# Patient Record
Sex: Male | Born: 2000 | Race: Black or African American | Hispanic: No | Marital: Single | Smoking: Never smoker
Health system: Southern US, Community
[De-identification: ages and names within clinical notes are randomized; demographics above are authoritative.]

---
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
## 2018-02-12 ENCOUNTER — Ambulatory Visit (HOSPITAL_COMMUNITY)
Admission: EM | Admit: 2018-02-12 | Discharge: 2018-02-12 | Disposition: A | Discharge status: Home / Self Care | Payer: Managed Care, Other (non HMO) | Attending: Family Medicine | Admitting: Family Medicine

## 2018-02-12 ENCOUNTER — Encounter (HOSPITAL_COMMUNITY): Payer: Self-pay | Admitting: *Deleted

## 2018-02-12 DIAGNOSIS — B349 Viral infection, unspecified: Secondary | ICD-10-CM | POA: Diagnosis not present

## 2018-02-12 MED ORDER — IBUPROFEN 400 MG PO TABS: 400.0000 mg | ORAL_TABLET | Freq: Four times a day (QID) | ORAL | 0 refills | Status: DC | PRN

## 2018-02-12 NOTE — ED Provider Notes (Signed)
[facility]    CSN: [id number] Arrival date & time: 02/12/18  1047     History   Chief Complaint Chief Complaint  Patient presents with  . Headache  . Fever    HPI Siddiq Kaluzny is a 17 y.o. male.   George Weaver presents with complaints of headache and fever which started last night after work. Temp of 100 last night. This am temp of 101.9. Headache 7/10, generalized. Took tylenol at 0700 which helped with fever. Denies cough, congestion, ear pain, sore throat, nausea, vomiting, diarrhea, rash, neck pain, urinary symptoms. No known ill contacts. No dizziness. Without contributing medical history.     ROS per HPI.      History reviewed. No pertinent past medical history.  There are no active problems to display for this patient.   History reviewed. No pertinent surgical history.     Home Medications    Prior to Admission medications   Medication Sig Start Date End Date Taking? Authorizing Provider  ibuprofen (ADVIL,MOTRIN) 400 MG tablet Take 1 tablet (400 mg total) by mouth every 6 (six) hours as needed. 02/12/18   Georgetta Haber, NP  Multiple Vitamin (MULITIVITAMIN WITH MINERALS) TABS Take 1 tablet by mouth daily.    [provider]  oseltamivir (TAMIFLU) 75 MG capsule Take 1 capsule (75 mg total) by mouth every 12 (twelve) hours. 10/24/16   Elvina Sidle, MD  vitamin C (ASCORBIC ACID) 500 MG tablet Take 1,000 mg by mouth daily.      [provider]    Family History Family History  Problem Relation Age of Onset  . Diabetes Mother     Social History Social History   Tobacco Use  . Smoking status: Never Smoker  . Smokeless tobacco: Never Used  Substance Use Topics  . Alcohol use: No  . Drug use: Not on file     Allergies   Patient has no known allergies.   Review of Systems Review of Systems   Physical Exam Triage Vital Signs ED Triage Vitals  Enc Vitals Group     BP 02/12/18 1131 119/73     Pulse --      Resp  02/12/18 1131 17     Temp 02/12/18 1131 98.7 F (37.1 C)     Temp Source 02/12/18 1131 Oral     SpO2 02/12/18 1131 99 %     Weight --      Height --      Head Circumference --      Peak Flow --      Pain Score 02/12/18 1130 7     Pain Loc --      Pain Edu? --      Excl. in GC? --    No data found.  Updated Vital Signs BP 119/73 (BP Location: Left Arm)   Temp 98.7 F (37.1 C) (Oral)   Resp 17   SpO2 99%    Physical Exam  Constitutional: He is oriented to person, place, and time. He appears well-developed and well-nourished.  HENT:  Head: Normocephalic and atraumatic.  Right Ear: Tympanic membrane, external ear and ear canal normal.  Left Ear: Tympanic membrane, external ear and ear canal normal.  Nose: Nose normal. Right sinus exhibits no maxillary sinus tenderness and no frontal sinus tenderness. Left sinus exhibits no maxillary sinus tenderness and no frontal sinus tenderness.  Mouth/Throat: Uvula is midline, oropharynx is clear and moist and mucous membranes are normal.  Eyes: Pupils are equal, round,  and reactive to light. Conjunctivae are normal.  Neck: Normal range of motion. No neck rigidity. No Brudzinski's sign and no Kernig's sign noted.  Cardiovascular: Normal rate and regular rhythm.  Pulmonary/Chest: Effort normal and breath sounds normal.  Abdominal: Soft. There is no tenderness.  Lymphadenopathy:    He has no cervical adenopathy.  Neurological: He is alert and oriented to person, place, and time.  Skin: Skin is warm and dry.  Vitals reviewed.    UC Treatments / Results  Labs (all labs ordered are listed, but only abnormal results are displayed) Labs Reviewed - No data to display  EKG None  Radiology No results found.  Procedures Procedures (including critical care time)  Medications Ordered in UC Medications - No data to display  Initial Impression / Assessment and Plan / UC Course  I have reviewed the triage vital signs and the nursing  notes.  Pertinent labs & imaging results that were available during my care of the patient were reviewed by me and considered in my medical decision making (see chart for details).     Afebrile in clinic today. Without tachypnea or tachycardia. Without acute findings on exam. History and physical consistent with viral illness.  Supportive cares recommended. Discussed that further symptoms may still develop. Return precautions provided. Patient verbalized understanding and agreeable to plan.  Ambulatory out of clinic without difficulty.    Final Clinical Impressions(s) / UC Diagnoses   Final diagnoses:  Viral illness     Discharge Instructions     Push fluids to ensure adequate hydration and keep secretions thin.  Tylenol and/or ibuprofen as needed for pain or fevers.  May take benadryl today as well to promote rest and help with headache. If symptoms worsen or do not improve in the next week to return to be seen or to follow up with your PCP.     ED Prescriptions    Medication Sig Dispense Auth. Provider   ibuprofen (ADVIL,MOTRIN) 400 MG tablet Take 1 tablet (400 mg total) by mouth every 6 (six) hours as needed. 30 tablet Georgetta HaberBurky, Ruairi Stutsman B, NP     Controlled Substance Prescriptions [place] Controlled Substance Registry consulted? Not Applicable   Georgetta HaberBurky, Manie Bealer B, NP 02/12/18 1208

## 2018-02-12 NOTE — Discharge Instructions (Signed)
Push fluids to ensure adequate hydration and keep secretions thin.  Tylenol and/or ibuprofen as needed for pain or fevers.  May take benadryl today as well to promote rest and help with headache. If symptoms worsen or do not improve in the next week to return to be seen or to follow up with your PCP.

## 2018-02-12 NOTE — ED Triage Notes (Addendum)
Patient reports headache and fever onset since last night. Patient denies any nasal drainage, nasal congestion, or sore throat. Denies cough. Patient reports taking tylenol at 7a

## 2019-05-16 ENCOUNTER — Encounter (HOSPITAL_BASED_OUTPATIENT_CLINIC_OR_DEPARTMENT_OTHER): Payer: Self-pay | Admitting: *Deleted

## 2019-05-16 ENCOUNTER — Emergency Department (HOSPITAL_BASED_OUTPATIENT_CLINIC_OR_DEPARTMENT_OTHER)
Admission: EM | Admit: 2019-05-16 | Discharge: 2019-05-16 | Disposition: A | Discharge status: Home / Self Care | Payer: Managed Care, Other (non HMO) | Attending: Emergency Medicine | Admitting: Emergency Medicine

## 2019-05-16 ENCOUNTER — Emergency Department (HOSPITAL_BASED_OUTPATIENT_CLINIC_OR_DEPARTMENT_OTHER): Payer: Managed Care, Other (non HMO)

## 2019-05-16 ENCOUNTER — Other Ambulatory Visit: Payer: Self-pay

## 2019-05-16 DIAGNOSIS — X500XXA Overexertion from strenuous movement or load, initial encounter: Secondary | ICD-10-CM | POA: Diagnosis not present

## 2019-05-16 DIAGNOSIS — Y9367 Activity, basketball: Secondary | ICD-10-CM | POA: Insufficient documentation

## 2019-05-16 DIAGNOSIS — Y999 Unspecified external cause status: Secondary | ICD-10-CM | POA: Insufficient documentation

## 2019-05-16 DIAGNOSIS — M25562 Pain in left knee: Secondary | ICD-10-CM | POA: Diagnosis not present

## 2019-05-16 DIAGNOSIS — Y92310 Basketball court as the place of occurrence of the external cause: Secondary | ICD-10-CM | POA: Insufficient documentation

## 2019-05-16 MED ORDER — IBUPROFEN 800 MG PO TABS: 800.0000 mg | ORAL_TABLET | Freq: Three times a day (TID) | ORAL | 0 refills | Status: DC | PRN

## 2019-05-16 NOTE — Discharge Instructions (Signed)

## 2019-05-16 NOTE — ED Provider Notes (Signed)
Emergency Department Provider Note   I have reviewed the triage vital signs and the nursing notes.   HISTORY  Chief Complaint Knee Pain   HPI George Weaver is a 18 y.o. male presents to the ED with left knee pain while playing basketball. He was planting and twisting when he felt a "pop" with pain. He has been ambulatory. No numbness/weakness. Pain is mild/moderate and mainly lateral per patient. No hip or ankle pain. He noted some mild swelling and presents to the ED for evaluation.    History reviewed. No pertinent past medical history.  There are no active problems to display for this patient.   History reviewed. No pertinent surgical history.  Allergies Patient has no known allergies.  Family History  Problem Relation Age of Onset  . Diabetes Mother     Social History Social History   Tobacco Use  . Smoking status: Never Smoker  . Smokeless tobacco: Never Used  Substance Use Topics  . Alcohol use: No  . Drug use: Never    Review of Systems  Constitutional: No fever/chills Eyes: No visual changes. ENT: No sore throat. Cardiovascular: Denies chest pain. Respiratory: Denies shortness of breath. Gastrointestinal: No abdominal pain.  No nausea, no vomiting.  No diarrhea.  No constipation. Genitourinary: Negative for dysuria. Musculoskeletal: Negative for back pain. Positive left knee pain.  Skin: Negative for rash. Neurological: Negative for headaches, focal weakness or numbness.  10-point ROS otherwise negative.  ____________________________________________   PHYSICAL EXAM:  VITAL SIGNS: ED Triage Vitals  Enc Vitals Group     BP 05/16/19 1921 113/80     Pulse Rate 05/16/19 1921 98     Resp 05/16/19 1921 18     Temp 05/16/19 1921 98.5 F (36.9 C)     Temp Source 05/16/19 1921 Oral     SpO2 05/16/19 1921 100 %     Weight 05/16/19 1921 250 lb (113.4 kg)     Height 05/16/19 1921 6\' 4"  (1.93 m)   Constitutional: Alert and oriented. Well  appearing and in no acute distress. Eyes: Conjunctivae are normal.  Head: Atraumatic. Nose: No congestion/rhinnorhea. Mouth/Throat: Mucous membranes are moist.  Neck: No stridor.  Cardiovascular: Normal rate, regular rhythm. Good peripheral circulation.  Respiratory: Normal respiratory effort.  Gastrointestinal: No distention.  Musculoskeletal: No joint laxity. Mild left lateral knee swelling. Normal exam of the ankle and left hip.  Neurologic:  Normal speech and language. Skin:  Skin is warm, dry and intact. No rash noted.  ____________________________________________  RADIOLOGY  Dg Knee Complete 4 Views Left  Result Date: 05/16/2019 CLINICAL DATA:  Knee pain. EXAM: LEFT KNEE - COMPLETE 4+ VIEW COMPARISON:  None. FINDINGS: No evidence of fracture, dislocation, or joint effusion. No evidence of arthropathy or other focal bone abnormality. Soft tissues are unremarkable. IMPRESSION: Negative. Electronically Signed   By: Annia Beltrew  Davis M.D.   On: 05/16/2019 20:09    ____________________________________________   PROCEDURES  Procedure(s) performed:   Procedures  None  ____________________________________________   INITIAL IMPRESSION / ASSESSMENT AND PLAN / ED COURSE  Pertinent labs & imaging results that were available during my care of the patient were reviewed by me and considered in my medical decision making (see chart for details).   Patient presents to the ED with left knee pain. Plain films negative. Plan for RICE and sports med f/u should pain continue. Info given at discharge.   ____________________________________________  FINAL CLINICAL IMPRESSION(S) / ED DIAGNOSES  Final diagnoses:  Acute pain of  left knee    Note:  This document was prepared using Dragon voice recognition software and may include unintentional dictation errors.  Nanda Quinton, MD Emergency Medicine    Long, [facility], MD 05/17/19 [phone]

## 2019-05-16 NOTE — ED Triage Notes (Signed)
Pt reports he was playing basketball today and when he came came his left knee popped and he fell on his right side. C/o knee pain and pain with ambulating

## 2019-05-18 ENCOUNTER — Ambulatory Visit (INDEPENDENT_AMBULATORY_CARE_PROVIDER_SITE_OTHER): Payer: Managed Care, Other (non HMO) | Admitting: Family Medicine

## 2019-05-18 ENCOUNTER — Encounter: Payer: Self-pay | Admitting: Family Medicine

## 2019-05-18 ENCOUNTER — Ambulatory Visit: Payer: Self-pay

## 2019-05-18 ENCOUNTER — Other Ambulatory Visit: Payer: Self-pay

## 2019-05-18 VITALS — BP 126/92 | Ht 73.0 in | Wt 250.0 lb

## 2019-05-18 DIAGNOSIS — M25562 Pain in left knee: Secondary | ICD-10-CM

## 2019-05-18 DIAGNOSIS — S83422A Sprain of lateral collateral ligament of left knee, initial encounter: Secondary | ICD-10-CM

## 2019-05-18 NOTE — Patient Instructions (Signed)
Nice to meet you Please try ice  Please try range of motion movements   Please wear the brace with any walking. Please try to use the crutches Please send me a message in MyChart with any questions or updates.  Please see me back in 2 weeks.   --Dr. Raeford Razor

## 2019-05-18 NOTE — Progress Notes (Signed)
George Weaver - 18 y.o. male MRN [id number]  Date of birth: 2000/10/30  SUBJECTIVE:  Including CC & ROS.  Chief Complaint  Patient presents with  . Knee Pain    left knee    George Weaver is a 18 y.o. male that is presenting with acute left knee pain.  He had an injury while playing basketball 9/13.  He was able to get up and walk after this.  Currently is having significant pain over the general knee.  He is having sharp and stabbing pain.  Seems to be localized to the knee.  No history of similar injuries or pain.  He is unsure of the mechanism of how he hurt his knee.  He did feel a pop when it occurred..  Independent review of the left knee x-ray from 9/13 shows no acute abnormality.   Review of Systems  Constitutional: Negative for fever.  HENT: Negative for congestion.   Respiratory: Negative for cough.   Cardiovascular: Negative for chest pain.  Gastrointestinal: Negative for abdominal distention.  Musculoskeletal: Positive for gait problem.  Skin: Negative for color change.  Neurological: Negative for weakness.  Hematological: Negative for adenopathy.    HISTORY: Past Medical, Surgical, Social, and Family History Reviewed & Updated per EMR.   Pertinent Historical Findings include:  No past medical history on file.  No past surgical history on file.  No Known Allergies  Family History  Problem Relation Age of Onset  . Diabetes Mother      Social History   Socioeconomic History  . Marital status: Single    Spouse name: Not on file  . Number of children: Not on file  . Years of education: Not on file  . Highest education level: Not on file  Occupational History  . Not on file  Social Needs  . Financial resource strain: Not on file  . Food insecurity    Worry: Not on file    Inability: Not on file  . Transportation needs    Medical: Not on file    Non-medical: Not on file  Tobacco Use  . Smoking status: Never Smoker  . Smokeless tobacco: Never Used   Substance and Sexual Activity  . Alcohol use: No  . Drug use: Never  . Sexual activity: Not on file  Lifestyle  . Physical activity    Days per week: Not on file    Minutes per session: Not on file  . Stress: Not on file  Relationships  . Social Herbalist on phone: Not on file    Gets together: Not on file    Attends religious service: Not on file    Active member of club or organization: Not on file    Attends meetings of clubs or organizations: Not on file    Relationship status: Not on file  . Intimate partner violence    Fear of current or ex partner: Not on file    Emotionally abused: Not on file    Physically abused: Not on file    Forced sexual activity: Not on file  Other Topics Concern  . Not on file  Social History Narrative  . Not on file     PHYSICAL EXAM:  VS: BP (!) 126/92   Ht 6\' 1"  (1.854 m)   Wt 250 lb (113.4 kg)   BMI 32.98 kg/m  Physical Exam Gen: NAD, alert, cooperative with exam, well-appearing ENT: normal lips, normal nasal mucosa,  Eye: normal EOM, normal conjunctiva  and lids CV:  no edema, +2 pedal pulses   Resp: no accessory muscle use, non-labored,  Skin: no rashes, no areas of induration  Neuro: normal tone, normal sensation to touch Psych:  normal insight, alert and oriented MSK:  Left knee: No obvious effusion. Normal passive flexion abduction. Some pain with varus stress testing. Tenderness palpation of the lateral femoral condyle. Positive Lockman and anterior drawer. Negative McMurray's test. Neurovascular intact  Limited ultrasound: Left knee:  Mild effusion within the suprapatellar pouch. Normal-appearing quadricep patellar tendon. Normal-appearing medial joint line. LCL appears to be intact but has a small effusion superficial to the ligament  Summary: Effusion and an LCL sprain  Ultrasound and interpretation by Clare GandyJeremy , MD      ASSESSMENT & PLAN:   Acute pain of left knee Appears to have an  LCL sprain.  He does not demonstrate a large effusion but there does appear to be translation on Lockman and anterior drawer testing.  He also heard a pop would suggest more of an ACL problem. -Hinged knee brace today. -Counseled on home exercise therapy and supportive care. -Provided with a work note. - Follow-up in 2 weeks.  May need to consider MRI

## 2019-05-19 DIAGNOSIS — S83519A Sprain of anterior cruciate ligament of unspecified knee, initial encounter: Secondary | ICD-10-CM | POA: Insufficient documentation

## 2019-05-19 NOTE — Assessment & Plan Note (Signed)
Appears to have an LCL sprain.  He does not demonstrate a large effusion but there does appear to be translation on Lockman and anterior drawer testing.  He also heard a pop would suggest more of an ACL problem. -Hinged knee brace today. -Counseled on home exercise therapy and supportive care. -Provided with a work note. - Follow-up in 2 weeks.  May need to consider MRI

## 2019-05-26 ENCOUNTER — Ambulatory Visit (INDEPENDENT_AMBULATORY_CARE_PROVIDER_SITE_OTHER): Payer: Managed Care, Other (non HMO) | Admitting: Family Medicine

## 2019-05-26 ENCOUNTER — Encounter: Payer: Self-pay | Admitting: Family Medicine

## 2019-05-26 ENCOUNTER — Other Ambulatory Visit: Payer: Self-pay

## 2019-05-26 VITALS — BP 134/82 | Ht 73.0 in | Wt 250.0 lb

## 2019-05-26 DIAGNOSIS — S83512D Sprain of anterior cruciate ligament of left knee, subsequent encounter: Secondary | ICD-10-CM | POA: Diagnosis not present

## 2019-05-26 NOTE — Patient Instructions (Signed)
Good to see you Please try ice and use the crutches to help with getting around  Please send me a message in MyChart with any questions or updates.  We will set up a virtual visit once the MRI is completed to discuss the results.   --Dr. Raeford Razor

## 2019-05-26 NOTE — Assessment & Plan Note (Signed)
Hasn't had much improvement in the knee. Likely ACL tear given history and exam.  - MRi to evaluate for ACL tear  - counseled on supportive care - provided work note.

## 2019-05-26 NOTE — Progress Notes (Signed)
George Weaver - 18 y.o. male MRN [id number]  Date of birth: 2000-11-15  SUBJECTIVE:  Including CC & ROS.  Chief Complaint  Patient presents with  . Follow-up    follow up for left knee    George Weaver is a 18 y.o. male that is following up for his left knee pain. He has been using crutches. The pain is about the same. His knee feels unstable. He doesn't feel comfortable placing his weight on the knee. No radicular symptoms. Pain is moderate to severe. Localized to the knee.   Review of Systems  Constitutional: Negative for fever.  HENT: Negative for congestion.   Respiratory: Negative for cough.   Cardiovascular: Negative for chest pain.  Gastrointestinal: Negative for abdominal pain.  Musculoskeletal: Positive for gait problem.  Skin: Negative for color change.  Neurological: Negative for weakness.  Hematological: Negative for adenopathy.    HISTORY: Past Medical, Surgical, Social, and Family History Reviewed & Updated per EMR.   Pertinent Historical Findings include:  No past medical history on file.  No past surgical history on file.  No Known Allergies  Family History  Problem Relation Age of Onset  . Diabetes Mother      Social History   Socioeconomic History  . Marital status: Single    Spouse name: Not on file  . Number of children: Not on file  . Years of education: Not on file  . Highest education level: Not on file  Occupational History  . Not on file  Social Needs  . Financial resource strain: Not on file  . Food insecurity    Worry: Not on file    Inability: Not on file  . Transportation needs    Medical: Not on file    Non-medical: Not on file  Tobacco Use  . Smoking status: Never Smoker  . Smokeless tobacco: Never Used  Substance and Sexual Activity  . Alcohol use: No  . Drug use: Never  . Sexual activity: Not on file  Lifestyle  . Physical activity    Days per week: Not on file    Minutes per session: Not on file  . Stress: Not on file   Relationships  . Social Musician on phone: Not on file    Gets together: Not on file    Attends religious service: Not on file    Active member of club or organization: Not on file    Attends meetings of clubs or organizations: Not on file    Relationship status: Not on file  . Intimate partner violence    Fear of current or ex partner: Not on file    Emotionally abused: Not on file    Physically abused: Not on file    Forced sexual activity: Not on file  Other Topics Concern  . Not on file  Social History Narrative  . Not on file     PHYSICAL EXAM:  VS: BP 134/82   Ht 6\' 1"  (1.854 m)   Wt 250 lb (113.4 kg)   BMI 32.98 kg/m  Physical Exam Gen: NAD, alert, cooperative with exam, well-appearing ENT: normal lips, normal nasal mucosa,  Eye: normal EOM, normal conjunctiva and lids CV:  no edema, +2 pedal pulses   Resp: no accessory muscle use, non-labored,  Skin: no rashes, no areas of induration  Neuro: normal tone, normal sensation to touch Psych:  normal insight, alert and oriented MSK:  Left knee:  Mild effusion  Normal strength to  resistance  Positive Anterior drawer and Lachman's test.  NVI      ASSESSMENT & PLAN:   ACL tear Hasn't had much improvement in the knee. Likely ACL tear given history and exam.  - MRi to evaluate for ACL tear  - counseled on supportive care - provided work note.

## 2019-06-01 ENCOUNTER — Ambulatory Visit: Payer: Managed Care, Other (non HMO) | Admitting: Family Medicine

## 2019-06-17 ENCOUNTER — Ambulatory Visit
Admission: RE | Admit: 2019-06-17 | Discharge: 2019-06-17 | Disposition: A | Discharge status: Home / Self Care | Payer: Managed Care, Other (non HMO) | Source: Ambulatory Visit | Attending: Family Medicine | Admitting: Family Medicine

## 2019-06-17 ENCOUNTER — Other Ambulatory Visit: Payer: Self-pay

## 2019-06-17 DIAGNOSIS — S83512D Sprain of anterior cruciate ligament of left knee, subsequent encounter: Secondary | ICD-10-CM

## 2019-06-21 ENCOUNTER — Telehealth: Payer: Self-pay | Admitting: Family Medicine

## 2019-06-21 ENCOUNTER — Ambulatory Visit (INDEPENDENT_AMBULATORY_CARE_PROVIDER_SITE_OTHER): Payer: Managed Care, Other (non HMO) | Admitting: Family Medicine

## 2019-06-21 ENCOUNTER — Other Ambulatory Visit: Payer: Self-pay

## 2019-06-21 DIAGNOSIS — S83512D Sprain of anterior cruciate ligament of left knee, subsequent encounter: Secondary | ICD-10-CM

## 2019-06-21 NOTE — Telephone Encounter (Signed)
Spoke with patient. Referral to PT placed.   Rosemarie Ax, MD [facility] 06/21/2019, 1:31 PM

## 2019-06-21 NOTE — Progress Notes (Signed)
Virtual Visit via Telephone Note  I connected with George Weaver on 06/21/19 at 10:50 AM EDT by telephone and verified that I am speaking with the correct person using two identifiers.   I discussed the limitations, risks, security and privacy concerns of performing an evaluation and management service by telephone and the availability of in person appointments. I also discussed with the patient that there may be a patient responsible charge related to this service. The patient expressed understanding and agreed to proceed.   History of Present Illness:  George Weaver is a 18 year old male that is following up for his left knee pain after an MRI was completed.  It was demonstrating a near complete tear of the ACL.  He reports going back to work and has been trying to do his normal activities.  He denies any severe pain and has been close to near normal.   Observations/Objective:  Gen: NAD, alert,  Psych:  normal insight, alert and oriented   Assessment and Plan:  ACL tear  MRI was confirming that he has a near complete ACL tear.He elects to perform conservative measures at this point. -Counseled on conservative therapy and surgery. -Counseled on brace. -Counseled on work he may need a work note. -He will call back if he would like to try physical therapy or pursue surgery.  Follow Up Instructions:    I discussed the assessment and treatment plan with the patient. The patient was provided an opportunity to ask questions and all were answered. The patient agreed with the plan and demonstrated an understanding of the instructions.   The patient was advised to call back or seek an in-person evaluation if the symptoms worsen or if the condition fails to improve as anticipated.  I provided 8 minutes of non-face-to-face time during this encounter.   Clearance Coots, MD

## 2019-06-21 NOTE — Assessment & Plan Note (Signed)
MRI was confirming that he has a near complete ACL tear.He elects to perform conservative measures at this point. -Counseled on conservative therapy and surgery. -Counseled on brace. -Counseled on work he may need a work note. -He will call back if he would like to try physical therapy or pursue surgery.

## 2019-08-30 ENCOUNTER — Encounter (HOSPITAL_COMMUNITY): Payer: Self-pay

## 2019-08-30 ENCOUNTER — Other Ambulatory Visit: Payer: Self-pay

## 2019-08-30 ENCOUNTER — Ambulatory Visit (HOSPITAL_COMMUNITY)
Admission: EM | Admit: 2019-08-30 | Discharge: 2019-08-30 | Disposition: A | Discharge status: Home / Self Care | Payer: Managed Care, Other (non HMO) | Attending: Family Medicine | Admitting: Family Medicine

## 2019-08-30 DIAGNOSIS — U071 COVID-19: Secondary | ICD-10-CM | POA: Diagnosis not present

## 2019-08-30 DIAGNOSIS — Z20822 Contact with and (suspected) exposure to covid-19: Secondary | ICD-10-CM

## 2019-08-30 DIAGNOSIS — J3489 Other specified disorders of nose and nasal sinuses: Secondary | ICD-10-CM | POA: Diagnosis not present

## 2019-08-30 DIAGNOSIS — Z20828 Contact with and (suspected) exposure to other viral communicable diseases: Secondary | ICD-10-CM | POA: Diagnosis present

## 2019-08-30 NOTE — Discharge Instructions (Signed)
You must quarantine until the test result is available The test can be reviewed on My Chart Tylenol for pain or fever May use OTC cough and cold medicine CALL for problems

## 2019-08-30 NOTE — ED Triage Notes (Signed)
Patient presents to Urgent Care with complaints of needing a covid test since waking up with a runny nose.

## 2019-08-30 NOTE — ED Provider Notes (Signed)
[facility]    CSN: [id number] Arrival date & time: 08/30/19  7829      History   Chief Complaint Chief Complaint  Patient presents with  . covid test    HPI George Weaver is a 18 y.o. male.   HPI  Patient woke up with clear rhinorrhea Feels well Must have covid test per his employer History reviewed. No pertinent past medical history.  Patient Active Problem List   Diagnosis Date Noted  . ACL tear 05/19/2019    History reviewed. No pertinent surgical history.     Home Medications    Prior to Admission medications   Medication Sig Start Date End Date Taking? Authorizing Provider  ibuprofen (ADVIL) 800 MG tablet Take 1 tablet (800 mg total) by mouth every 8 (eight) hours as needed for moderate pain. 05/16/19   Long, Wonda Olds, MD  Multiple Vitamin (MULITIVITAMIN WITH MINERALS) TABS Take 1 tablet by mouth daily.    [provider]  vitamin C (ASCORBIC ACID) 500 MG tablet Take 1,000 mg by mouth daily.      [provider]    Family History Family History  Problem Relation Age of Onset  . Diabetes Mother     Social History Social History   Tobacco Use  . Smoking status: Never Smoker  . Smokeless tobacco: Never Used  Substance Use Topics  . Alcohol use: No  . Drug use: Never     Allergies   Patient has no known allergies.   Review of Systems Review of Systems  Constitutional: Negative for chills and fever.  HENT: Positive for rhinorrhea. Negative for congestion and hearing loss.   Eyes: Negative for pain.  Respiratory: Negative for cough and shortness of breath.   Cardiovascular: Negative for chest pain and leg swelling.  Gastrointestinal: Negative for abdominal pain, constipation and diarrhea.  Genitourinary: Negative for dysuria and frequency.  Musculoskeletal: Negative for myalgias.  Neurological: Negative for dizziness, seizures and headaches.  Psychiatric/Behavioral: The patient is not nervous/anxious.       Physical Exam Triage Vital Signs ED Triage Vitals  Enc Vitals Group     BP 08/30/19 0948 125/76     Pulse Rate 08/30/19 0948 86     Resp 08/30/19 0948 16     Temp 08/30/19 0948 99.5 F (37.5 C)     Temp Source 08/30/19 0948 Oral     SpO2 08/30/19 0948 99 %     Weight --      Height --      Head Circumference --      Peak Flow --      Pain Score 08/30/19 0946 0     Pain Loc --      Pain Edu? --      Excl. in [place]? --    No data found.  Updated Vital Signs BP 125/76 (BP Location: Right Arm)   Pulse 86   Temp 99.5 F (37.5 C) (Oral)   Resp 16   SpO2 99%   Visual Acuity Right Eye Distance:   Left Eye Distance:   Bilateral Distance:    Right Eye Near:   Left Eye Near:    Bilateral Near:     Physical Exam Constitutional:      General: He is not in acute distress.    Appearance: He is well-developed. He is obese.  HENT:     Head: Normocephalic and atraumatic.     Right Ear: Tympanic membrane and ear canal normal.  Left Ear: Tympanic membrane and ear canal normal.     Nose: Congestion and rhinorrhea present.     Mouth/Throat:     Mouth: Mucous membranes are moist.     Pharynx: No posterior oropharyngeal erythema.  Eyes:     Conjunctiva/sclera: Conjunctivae normal.     Pupils: Pupils are equal, round, and reactive to light.  Cardiovascular:     Rate and Rhythm: Normal rate and regular rhythm.     Heart sounds: Normal heart sounds.  Pulmonary:     Effort: Pulmonary effort is normal. No respiratory distress.     Breath sounds: Normal breath sounds.  Abdominal:     General: There is no distension.     Palpations: Abdomen is soft.  Musculoskeletal:        General: Normal range of motion.     Cervical back: Normal range of motion.  Skin:    General: Skin is warm and dry.  Neurological:     Mental Status: He is alert.  Psychiatric:        Mood and Affect: Mood normal.        Behavior: Behavior normal.      UC Treatments / Results  Labs (all labs  ordered are listed, but only abnormal results are displayed) Labs Reviewed  NOVEL CORONAVIRUS, NAA (HOSP ORDER, SEND-OUT TO REF LAB; TAT 18-24 HRS)    EKG   Radiology No results found.  Procedures Procedures (including critical care time)  Medications Ordered in UC Medications - No data to display  Initial Impression / Assessment and Plan / UC Course  I have reviewed the triage vital signs and the nursing notes.  Pertinent labs & imaging results that were available during my care of the patient were reviewed by me and considered in my medical decision making (see chart for details).     discussed Final Clinical Impressions(s) / UC Diagnoses   Final diagnoses:  Rhinorrhea  Encounter for laboratory testing for COVID-19 virus     Discharge Instructions     You must quarantine until the test result is available The test can be reviewed on My Chart Tylenol for pain or fever May use OTC cough and cold medicine CALL for problems   ED Prescriptions    None     PDMP not reviewed this encounter.   Eustace Moore, MD 08/30/19 2142

## 2019-08-31 LAB — NOVEL CORONAVIRUS, NAA (HOSP ORDER, SEND-OUT TO REF LAB; TAT 18-24 HRS): SARS-CoV-2, NAA: DETECTED — AB

## 2019-09-01 ENCOUNTER — Telehealth: Payer: Self-pay | Admitting: Emergency Medicine

## 2019-09-01 NOTE — Telephone Encounter (Signed)

## 2019-09-02 ENCOUNTER — Telehealth (HOSPITAL_COMMUNITY): Payer: Self-pay | Admitting: Emergency Medicine

## 2019-09-02 NOTE — Telephone Encounter (Signed)
Attempted to reach patient. No answer at this time. Voicemail left.  Letter sent.

## 2019-09-02 NOTE — Telephone Encounter (Signed)
Patient contacted by phone and made aware of  Positive covid results. Pt verbalized understanding and had all questions answered.   

## 2020-02-26 ENCOUNTER — Ambulatory Visit (HOSPITAL_COMMUNITY)
Admission: EM | Admit: 2020-02-26 | Discharge: 2020-02-26 | Disposition: A | Discharge status: Home / Self Care | Payer: Managed Care, Other (non HMO) | Attending: Physician Assistant | Admitting: Physician Assistant

## 2020-02-26 ENCOUNTER — Other Ambulatory Visit: Payer: Self-pay

## 2020-02-26 ENCOUNTER — Encounter (HOSPITAL_COMMUNITY): Payer: Self-pay

## 2020-02-26 DIAGNOSIS — H6121 Impacted cerumen, right ear: Secondary | ICD-10-CM

## 2020-02-26 NOTE — ED Notes (Signed)
Irrigated right ear with warm water/H2O2; large solid matter of cerumen evacuated. Pt states he feels much better.

## 2020-02-26 NOTE — ED Provider Notes (Signed)
[facility]    CSN: [id number] Arrival date & time: 02/26/20  1406      History   Chief Complaint Chief Complaint  Patient presents with  . ear fullness    HPI George Weaver is a 19 y.o. male.      Who awoke this am with decreased hearing and "fullness" of the right ear. No pain. No congestion. No fevers. No additional symtpoms.      No past medical history on file.  Patient Active Problem List   Diagnosis Date Noted  . ACL tear 05/19/2019    No past surgical history on file.     Home Medications    Prior to Admission medications   Medication Sig Start Date End Date Taking? Authorizing Provider  ibuprofen (ADVIL) 800 MG tablet Take 1 tablet (800 mg total) by mouth every 8 (eight) hours as needed for moderate pain. 05/16/19   Long, Arlyss Repress, MD  Multiple Vitamin (MULITIVITAMIN WITH MINERALS) TABS Take 1 tablet by mouth daily.    [provider]  vitamin C (ASCORBIC ACID) 500 MG tablet Take 1,000 mg by mouth daily.      [provider]    Family History Family History  Problem Relation Age of Onset  . Diabetes Mother     Social History Social History   Tobacco Use  . Smoking status: Never Smoker  . Smokeless tobacco: Never Used  Vaping Use  . Vaping Use: Never used  Substance Use Topics  . Alcohol use: No  . Drug use: Never     Allergies   Patient has no known allergies.   Review of Systems Review of Systems  Constitutional: Negative for fever.  HENT: Negative for congestion, ear discharge and ear pain.   Respiratory: Negative for cough.   Allergic/Immunologic: Negative for food allergies.  All other systems reviewed and are negative.    Physical Exam Triage Vital Signs ED Triage Vitals  Enc Vitals Group     BP 02/26/20 1423 122/77     Pulse Rate 02/26/20 1423 82     Resp 02/26/20 1423 16     Temp 02/26/20 1423 98.4 F (36.9 C)     Temp Source 02/26/20 1423 Oral     SpO2 02/26/20 1423 99 %     Weight  --      Height --      Head Circumference --      Peak Flow --      Pain Score 02/26/20 1419 0     Pain Loc --      Pain Edu? --      Excl. in [place]? --    No data found.  Updated Vital Signs BP 122/77 (BP Location: Right Arm)   Pulse 82   Temp 98.4 F (36.9 C) (Oral)   Resp 16   SpO2 99%   Visual Acuity Right Eye Distance:   Left Eye Distance:   Bilateral Distance:    Right Eye Near:   Left Eye Near:    Bilateral Near:     Physical Exam Vitals and nursing note reviewed.  Constitutional:      Appearance: Normal appearance.  HENT:     Right Ear: There is impacted cerumen.     Ears:     Comments: Right ear with impacted cerumen Eyes:     General: No scleral icterus. Pulmonary:     Effort: Pulmonary effort is normal.  Musculoskeletal:     Cervical back: Neck  supple.  Skin:    General: Skin is warm and dry.     Findings: No rash.  Neurological:     General: No focal deficit present.     Mental Status: He is alert and oriented to person, place, and time.  Psychiatric:        Mood and Affect: Mood normal.      UC Treatments / Results  Labs (all labs ordered are listed, but only abnormal results are displayed) Labs Reviewed - No data to display  EKG   Radiology No results found.  Procedures Procedures (including critical care time)  Medications Ordered in UC Medications - No data to display  Initial Impression / Assessment and Plan / UC Course  I have reviewed the triage vital signs and the nursing notes.  Pertinent labs & imaging results that were available during my care of the patient were reviewed by me and considered in my medical decision making (see chart for details).     Successful ear wash. Suggested OTC regimens in the future for prevention.  Final Clinical Impressions(s) / UC Diagnoses   Final diagnoses:  None   Discharge Instructions   None    ED Prescriptions    None     PDMP not reviewed this encounter.   Bjorn Pippin, [place] 02/26/20 1507

## 2020-02-26 NOTE — ED Triage Notes (Signed)
Pt c/o difficulty hearing/ear fullness to right ear onset this morning. Denies ear pain, HA, congestion, sore throat, fever, chills or other c/o.  States h/o cerumen impaction.

## 2020-02-26 NOTE — Discharge Instructions (Addendum)
May use Debrox otc to help prevent further issues with this. Also a ear candle is useful as well.

## 2020-06-05 IMAGING — MR MR KNEE*L* W/O CM
7 series · 38 of 40 positions shown · non-contrast
Comparison: Left knee x-rays dated May 16, 2019.

CLINICAL DATA: Persistent left knee pain since injury playing
baseball a month ago.

EXAM:
MRI OF THE LEFT KNEE WITHOUT CONTRAST
TECHNIQUE: Multiplanar, multisequence MR imaging of the knee was performed. No
intravenous contrast was administered.

[Series 6: T2 fat-sat · axial · left · 4.0mm · 0.50mm/px · z∈[-82,+70]mm · 6 of 36 slices shown (1 of 3)]
[im 1/36]
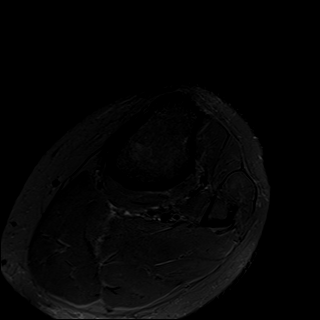
[im 8/36]
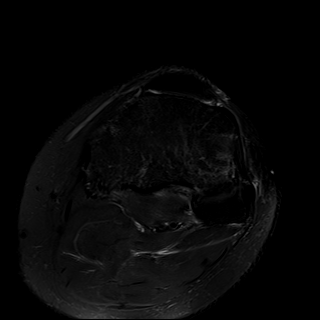
[im 15/36]
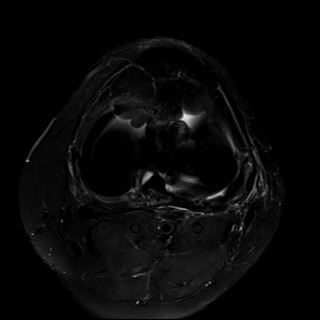
[im 22/36]
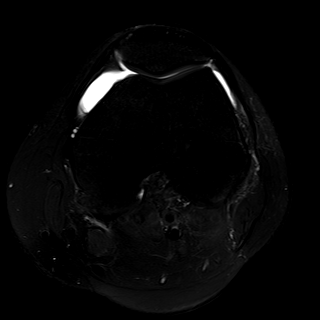
[im 29/36]
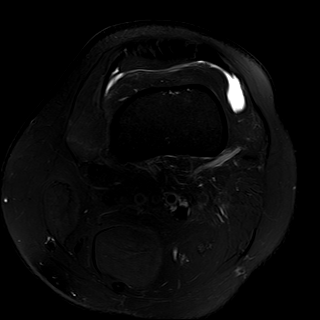
[im 36/36]
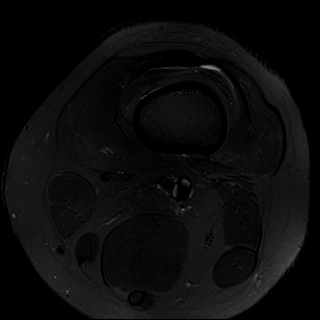

[Series 7: T2 fat-sat · coronal · left · 4.0mm · 0.39mm/px · 6 of 28 slices shown (2 of 3)]
[im 1/28]
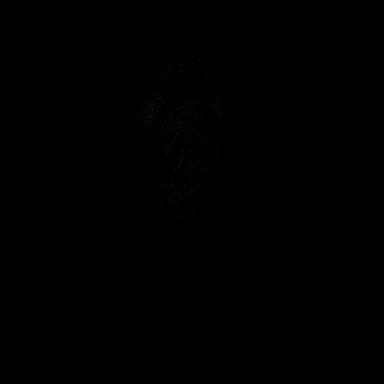
[im 6/28]
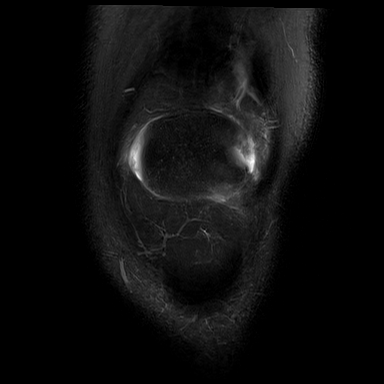
[im 11/28]
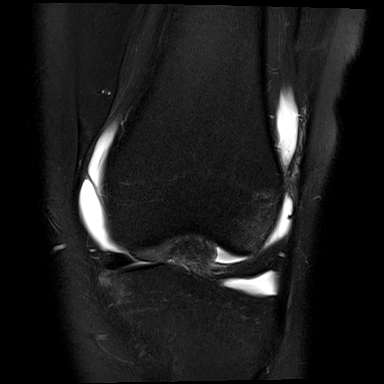
[im 17/28]
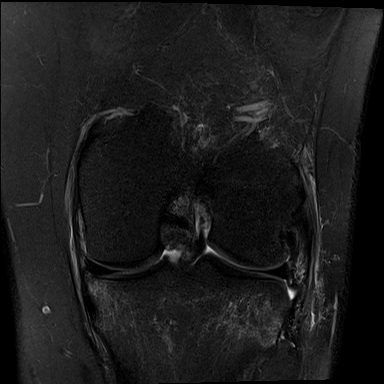
[im 22/28]
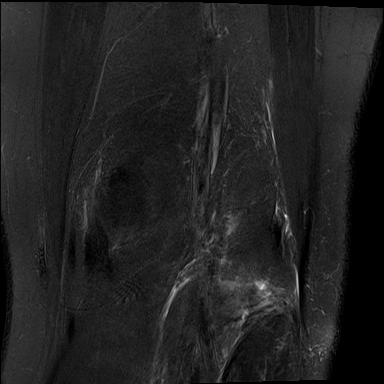
[im 28/28]
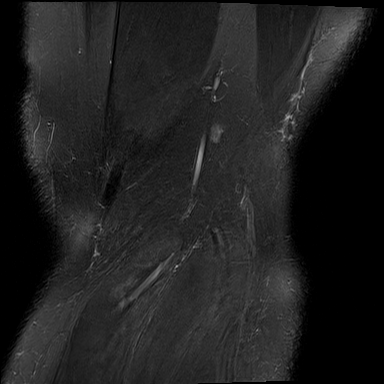

[Series 8: T1 · coronal · left · 4.0mm · 0.39mm/px · 4 of 28 slices shown]
[im 1/28]
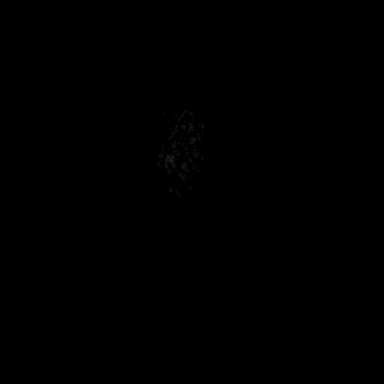
[im 6/28]
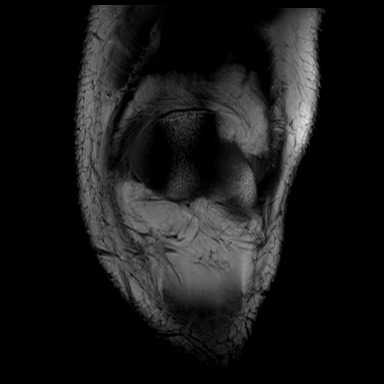
[im 11/28]
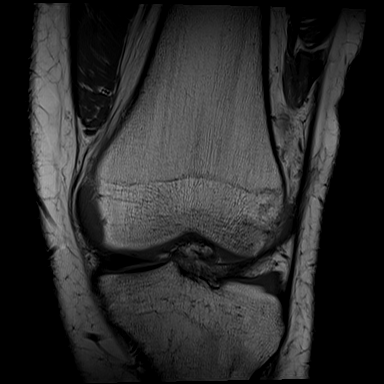
[im 17/28]
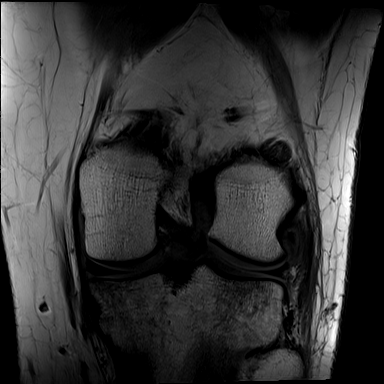

[Series 9: PD fat-sat · coronal · left · 3.0mm · 0.47mm/px · 6 of 28 slices shown (1 of 2)]
[im 1/28]
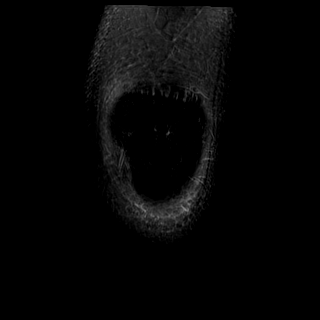
[im 6/28]
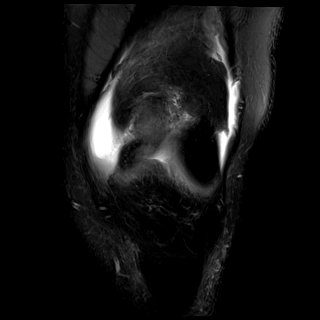
[im 11/28]
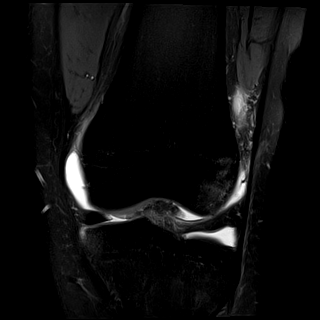
[im 17/28]
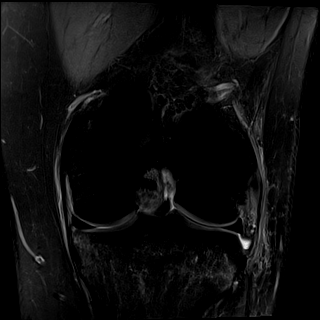
[im 22/28]
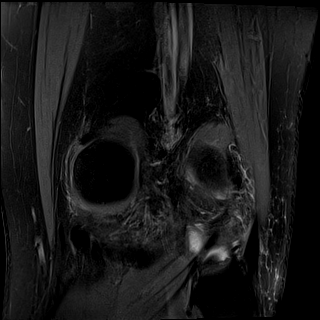
[im 28/28]
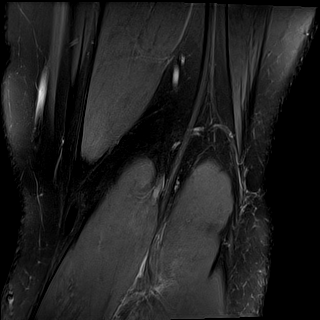

[Series 10: PD fat-sat · sagittal · left · 3.0mm · 0.39mm/px · 6 of 27 slices shown (2 of 2)]
[im 1/27]
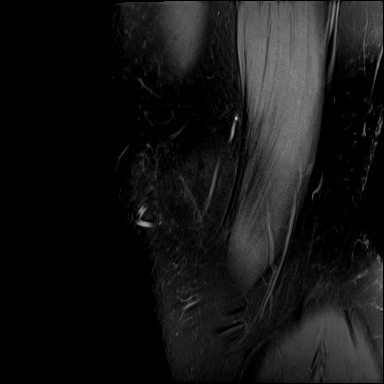
[im 6/27]
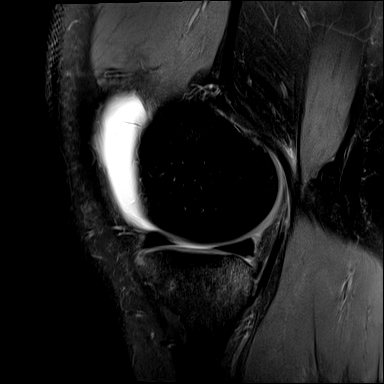
[im 11/27]
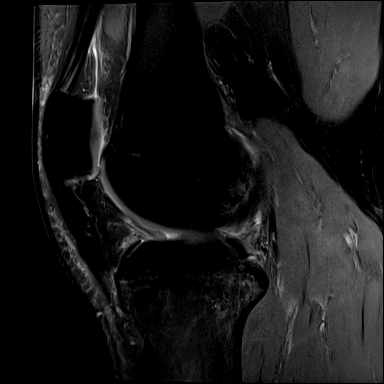
[im 16/27]
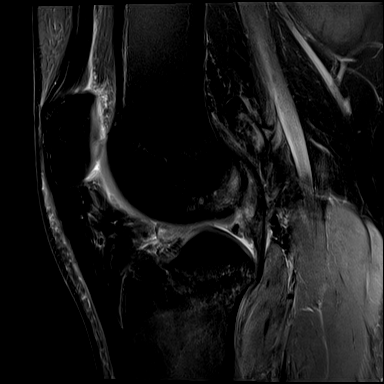
[im 21/27]
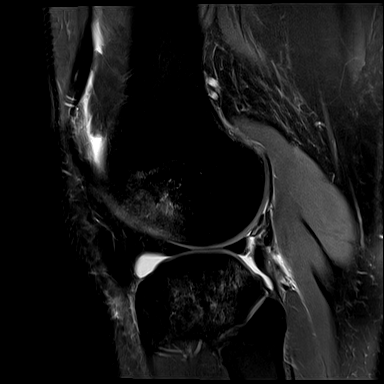
[im 27/27]
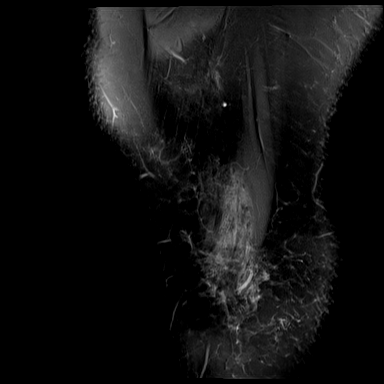

[Series 11: T2 fat-sat · sagittal · left · 3.0mm · 0.39mm/px · 6 of 27 slices shown (3 of 3)]
[im 1/27]
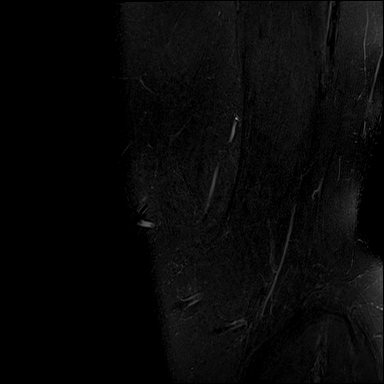
[im 6/27]
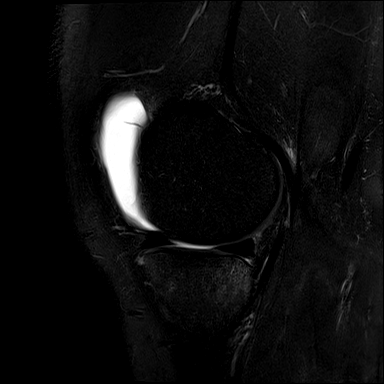
[im 11/27]
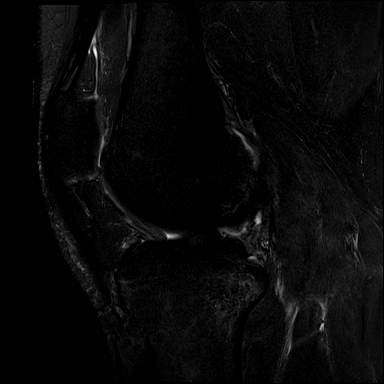
[im 16/27]
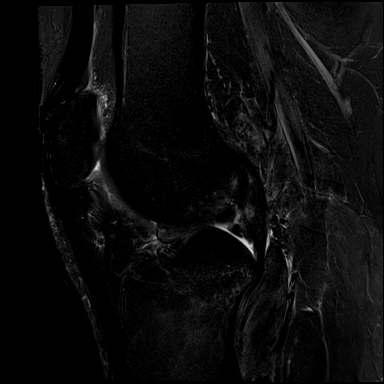
[im 21/27]
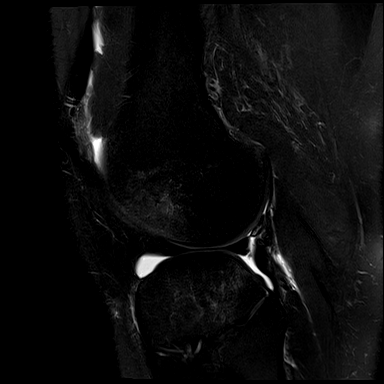
[im 27/27]
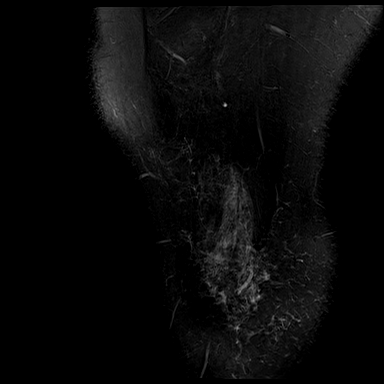

[Series 12: PD · oblique · left · 1.5mm · 0.44mm/px · 4 of 21 slices shown]
[im 1/21]
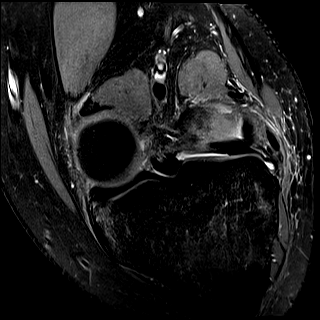
[im 7/21]
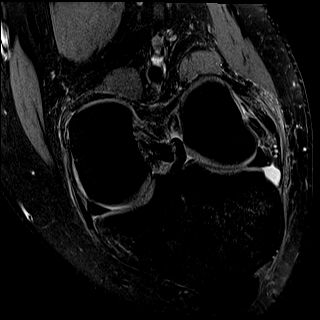
[im 14/21]
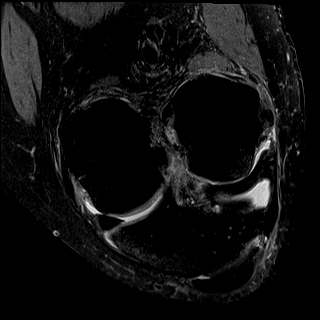
[im 21/21]
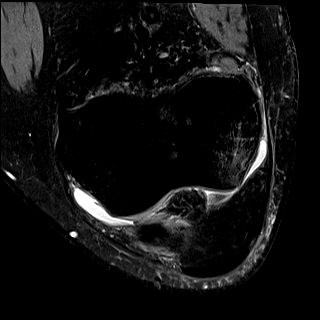

[38 of 40 positions shown; findings below may reference images not displayed]

FINDINGS: Left knee x-rays dated May 16, 2019.

MENISCI

Medial meniscus:  Intact.

Lateral meniscus:  Intact.

LIGAMENTS

Cruciates: High-grade near complete tear of the ACL. The PCL is
intact.

Collaterals: Medial collateral ligament is intact. Lateral
collateral ligament complex is intact.

CARTILAGE

Patellofemoral:  Normal.

Medial:  Normal.

Lateral:  Normal.

Joint: Small joint effusion. Normal Hoffa's fat. No plical
thickening.

Popliteal Fossa:  No Baker cyst. Intact popliteus tendon.

Extensor Mechanism: Intact quadriceps tendon and patellar tendon.
Intact medial and lateral patellar retinaculum. Intact MPFL.

Bones: Contusions in the peripheral lateral femoral condyle and
posterior lateral and medial tibial plateaus. No acute fracture or
dislocation. No suspicious bone lesion.

Other: None.
IMPRESSION: 1. High-grade near complete tear of the ACL.
2. Associated typical osseous injuries of pivot-shift mechanism. No
fracture.

## 2022-12-16 ENCOUNTER — Encounter: Payer: Self-pay | Admitting: *Deleted

## 2024-04-05 ENCOUNTER — Ambulatory Visit (HOSPITAL_COMMUNITY)
Admission: EM | Admit: 2024-04-05 | Discharge: 2024-04-05 | Disposition: A | Discharge status: Home / Self Care | Payer: Self-pay | Attending: Physician Assistant | Admitting: Physician Assistant

## 2024-04-05 ENCOUNTER — Encounter (HOSPITAL_COMMUNITY): Payer: Self-pay

## 2024-04-05 DIAGNOSIS — R52 Pain, unspecified: Secondary | ICD-10-CM

## 2024-04-05 DIAGNOSIS — R051 Acute cough: Secondary | ICD-10-CM

## 2024-04-05 DIAGNOSIS — J069 Acute upper respiratory infection, unspecified: Secondary | ICD-10-CM

## 2024-04-05 LAB — POC COVID19/FLU A&B COMBO
Covid Antigen, POC: NEGATIVE
Influenza A Antigen, POC: NEGATIVE
Influenza B Antigen, POC: NEGATIVE

## 2024-04-05 MED ORDER — FLUTICASONE PROPIONATE 50 MCG/ACT NA SUSP: 1.0000 | Freq: Every day | NASAL | 0 refills | Status: AC

## 2024-04-05 MED ORDER — PROMETHAZINE-DM 6.25-15 MG/5ML PO SYRP: 5.0000 mL | ORAL_SOLUTION | Freq: Two times a day (BID) | ORAL | 0 refills | Status: AC | PRN

## 2024-04-05 NOTE — ED Notes (Addendum)
 Patient c/o body aches, nasal congestion and anon productive cough x 2 days.  Patient states he has been taking Aleve and allegra for his symptoms.

## 2024-04-05 NOTE — Discharge Instructions (Signed)
 You tested negative for flu and COVID.  I suspect you have a different virus that is causing your symptoms.  Continue Tylenol  and ibuprofen  for pain and fever.  Start Promethazine  DM up to twice a day for cough.  This will make you sleepy so do not drive or drink alcohol taking it.  I recommend nasal saline/sinus rinses as well as Mucinex  and Flonase  for symptom relief.  I have sent Flonase  to the pharmacy.  If you are not feeling better in a week please return for reevaluation.  If anything worsens and you have worsening cough, shortness of breath, high fever, nausea/vomiting interfering through intake you need to be seen immediately.

## 2024-04-05 NOTE — ED Provider Notes (Signed)
 [facility]    CSN: [id number] Arrival date & time: 04/05/24  1106      History   Chief Complaint No chief complaint on file.   HPI George Weaver is a 23 y.o. male.   Patient presents today with a 2-day history of nasal congestion, sore throat, body aches, chills, subjective fever.  Denies any chest pain, shortness of breath, nausea, vomiting, diarrhea.  Denies any known sick contacts.  He has had COVID several years ago but not more recently.  He has not had COVID-19 vaccination or influenza vaccine.  He has tried Allegra as well as Aleve without improvement of symptoms.  Denies any history of allergies, asthma, COPD, smoking.  Denies any antibiotics or steroids in the past 90 days.  He is having difficulty with his daily activities as a result of the symptoms.    History reviewed. No pertinent past medical history.  Patient Active Problem List   Diagnosis Date Noted   ACL tear 05/19/2019    History reviewed. No pertinent surgical history.     Home Medications    Prior to Admission medications   Medication Sig Start Date End Date Taking? Authorizing Provider  fluticasone  (FLONASE ) 50 MCG/ACT nasal spray Place 1 spray into both nostrils daily. 04/05/24  Yes Bowe Sidor K, PA-C  promethazine -dextromethorphan  (PROMETHAZINE -DM) 6.25-15 MG/5ML syrup Take 5 mLs by mouth 2 (two) times daily as needed for cough. 04/05/24  Yes Lynnel Zanetti K, PA-C  Multiple Vitamin (MULITIVITAMIN WITH MINERALS) TABS Take 1 tablet by mouth daily.    [provider]  vitamin C (ASCORBIC ACID) 500 MG tablet Take 1,000 mg by mouth daily.      [provider]    Family History Family History  Problem Relation Age of Onset   Diabetes Mother     Social History Social History   Tobacco Use   Smoking status: Never   Smokeless tobacco: Never  Vaping Use   Vaping status: Never Used  Substance Use Topics   Alcohol use: No   Drug use: Never     Allergies   Patient  has no known allergies.   Review of Systems Review of Systems  Constitutional:  Positive for activity change, chills, fatigue and fever. Negative for appetite change.  HENT:  Positive for congestion and sore throat. Negative for sinus pressure and sneezing.   Respiratory:  Positive for cough. Negative for shortness of breath.   Cardiovascular:  Negative for chest pain.  Gastrointestinal:  Negative for abdominal pain, diarrhea, nausea and vomiting.  Musculoskeletal:  Positive for arthralgias and myalgias.  Neurological:  Negative for dizziness, light-headedness and headaches.     Physical Exam Triage Vital Signs ED Triage Vitals [04/05/24 1204]  Encounter Vitals Group     BP 133/89     Girls Systolic BP Percentile      Girls Diastolic BP Percentile      Boys Systolic BP Percentile      Boys Diastolic BP Percentile      Pulse Rate 80     Resp 16     Temp 97.9 F (36.6 C)     Temp Source Oral     SpO2 100 %     Weight      Height      Head Circumference      Peak Flow      Pain Score      Pain Loc      Pain Education  Exclude from Growth Chart    No data found.  Updated Vital Signs BP 133/89 (BP Location: Right Arm)   Pulse 80   Temp 97.9 F (36.6 C) (Oral)   Resp 16   SpO2 100%   Visual Acuity Right Eye Distance:   Left Eye Distance:   Bilateral Distance:    Right Eye Near:   Left Eye Near:    Bilateral Near:     Physical Exam Vitals reviewed.  Constitutional:      General: He is awake.     Appearance: Normal appearance. He is well-developed. He is not ill-appearing.     Comments: Very pleasant male appears stated age in no acute distress sitting comfortably in exam room  HENT:     Head: Normocephalic and atraumatic.     Right Ear: External ear normal. There is impacted cerumen.     Left Ear: External ear normal. There is impacted cerumen.     Nose: Nose normal.     Mouth/Throat:     Pharynx: Uvula midline. Posterior oropharyngeal erythema  present. No oropharyngeal exudate or uvula swelling.  Cardiovascular:     Rate and Rhythm: Normal rate and regular rhythm.     Heart sounds: Normal heart sounds, S1 normal and S2 normal. No murmur heard. Pulmonary:     Effort: Pulmonary effort is normal. No accessory muscle usage or respiratory distress.     Breath sounds: Normal breath sounds. No stridor. No wheezing, rhonchi or rales.     Comments: Clear to auscultation bilaterally Neurological:     Mental Status: He is alert.  Psychiatric:        Behavior: Behavior is cooperative.      UC Treatments / Results  Labs (all labs ordered are listed, but only abnormal results are displayed) Labs Reviewed  POC COVID19/FLU A&B COMBO    EKG   Radiology No results found.  Procedures Procedures (including critical care time)  Medications Ordered in UC Medications - No data to display  Initial Impression / Assessment and Plan / UC Course  I have reviewed the triage vital signs and the nursing notes.  Pertinent labs & imaging results that were available during my care of the patient were reviewed by me and considered in my medical decision making (see chart for details).     Patient is well-appearing, afebrile, nontoxic, nontachycardic.  Viral testing was negative for COVID and flu.  Suspect he has a different virus and there is no evidence of acute infection on physical exam so antibiotics were deferred.  Chest x-ray was deferred as he had no adventitious lung sounds on exam his oxygen saturation was 100%.  Will treat symptomatically and he was given Promethazine  DM for cough.  We discussed that this can be sedating and he is not to drive or drink alcohol while taking it.  He was given fluticasone  nasal spray to help with symptoms and encouraged use over-the-counter medicine such as Mucinex , nasal saline/sinus rinses.  Discussed that if is not feeling better in a week or if anything worsens he needs to be seen immediately.  Strict  return precautions given.  Excuse note provided.  Final Clinical Impressions(s) / UC Diagnoses   Final diagnoses:  Acute cough  Body aches  Upper respiratory tract infection, unspecified type     Discharge Instructions      You tested negative for flu and COVID.  I suspect you have a different virus that is causing your symptoms.  Continue Tylenol  and  ibuprofen  for pain and fever.  Start Promethazine  DM up to twice a day for cough.  This will make you sleepy so do not drive or drink alcohol taking it.  I recommend nasal saline/sinus rinses as well as Mucinex  and Flonase  for symptom relief.  I have sent Flonase  to the pharmacy.  If you are not feeling better in a week please return for reevaluation.  If anything worsens and you have worsening cough, shortness of breath, high fever, nausea/vomiting interfering through intake you need to be seen immediately.     ED Prescriptions     Medication Sig Dispense Auth. Provider   promethazine -dextromethorphan  (PROMETHAZINE -DM) 6.25-15 MG/5ML syrup Take 5 mLs by mouth 2 (two) times daily as needed for cough. 70 mL Vianey Caniglia K, PA-C   fluticasone  (FLONASE ) 50 MCG/ACT nasal spray Place 1 spray into both nostrils daily. 16 g Vaanya Shambaugh K, PA-C      PDMP not reviewed this encounter.   Sherrell Rocky POUR, PA-C 04/05/24 1359

## 2024-09-09 ENCOUNTER — Encounter (HOSPITAL_COMMUNITY): Payer: Self-pay

## 2024-09-09 ENCOUNTER — Ambulatory Visit (HOSPITAL_COMMUNITY): Payer: Self-pay

## 2024-09-09 ENCOUNTER — Ambulatory Visit (HOSPITAL_COMMUNITY)
Admission: EM | Admit: 2024-09-09 | Discharge: 2024-09-09 | Disposition: A | Discharge status: Home / Self Care | Payer: Self-pay

## 2024-09-09 DIAGNOSIS — M542 Cervicalgia: Secondary | ICD-10-CM

## 2024-09-09 MED ORDER — IBUPROFEN 800 MG PO TABS
ORAL_TABLET | ORAL | Status: AC
  Filled 2024-09-09: qty 1

## 2024-09-09 MED ORDER — IBUPROFEN 800 MG PO TABS
800.0000 mg | ORAL_TABLET | Freq: Once | ORAL | Status: AC
  Administered 2024-09-09: 800 mg via ORAL
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
